# Patient Record
Sex: Male | Born: 1960 | Race: White | Hispanic: No | Marital: Married | Smoking: Never smoker
Health system: Southern US, Community
[De-identification: ages and names within clinical notes are randomized; demographics above are authoritative.]

## PROBLEM LIST (undated history)

## (undated) DIAGNOSIS — T39395A Adverse effect of other nonsteroidal anti-inflammatory drugs [NSAID], initial encounter: Secondary | ICD-10-CM

## (undated) DIAGNOSIS — G43909 Migraine, unspecified, not intractable, without status migrainosus: Secondary | ICD-10-CM

## (undated) DIAGNOSIS — K922 Gastrointestinal hemorrhage, unspecified: Secondary | ICD-10-CM

## (undated) HISTORY — PX: APPENDECTOMY: SHX54

---

## 2002-07-29 ENCOUNTER — Inpatient Hospital Stay (HOSPITAL_COMMUNITY): Admission: EM | Admit: 2002-07-29 | Discharge: 2002-07-29 | Payer: Self-pay | Admitting: *Deleted

## 2002-07-29 ENCOUNTER — Encounter: Payer: Self-pay | Admitting: *Deleted

## 2005-05-08 ENCOUNTER — Encounter: Admission: RE | Admit: 2005-05-08 | Discharge: 2005-05-08 | Payer: Self-pay | Admitting: Family Medicine

## 2005-08-21 ENCOUNTER — Ambulatory Visit (HOSPITAL_COMMUNITY): Admission: RE | Admit: 2005-08-21 | Discharge: 2005-08-21 | Payer: Self-pay | Admitting: Gastroenterology

## 2007-10-11 ENCOUNTER — Ambulatory Visit: Payer: Self-pay | Admitting: Family Medicine

## 2011-05-11 ENCOUNTER — Other Ambulatory Visit (INDEPENDENT_AMBULATORY_CARE_PROVIDER_SITE_OTHER): Payer: BC Managed Care – PPO

## 2011-05-11 DIAGNOSIS — Z23 Encounter for immunization: Secondary | ICD-10-CM

## 2011-05-11 DIAGNOSIS — Z0000 Encounter for general adult medical examination without abnormal findings: Secondary | ICD-10-CM

## 2016-08-16 ENCOUNTER — Emergency Department (HOSPITAL_COMMUNITY): Payer: BC Managed Care – PPO

## 2016-08-16 ENCOUNTER — Emergency Department (HOSPITAL_COMMUNITY)
Admission: EM | Admit: 2016-08-16 | Discharge: 2016-08-16 | Disposition: A | Discharge status: Home / Self Care | Payer: BC Managed Care – PPO | Attending: Emergency Medicine | Admitting: Emergency Medicine

## 2016-08-16 ENCOUNTER — Encounter (HOSPITAL_COMMUNITY): Payer: Self-pay | Admitting: Nurse Practitioner

## 2016-08-16 DIAGNOSIS — S42031A Displaced fracture of lateral end of right clavicle, initial encounter for closed fracture: Secondary | ICD-10-CM | POA: Diagnosis not present

## 2016-08-16 DIAGNOSIS — Z79899 Other long term (current) drug therapy: Secondary | ICD-10-CM | POA: Insufficient documentation

## 2016-08-16 DIAGNOSIS — Y999 Unspecified external cause status: Secondary | ICD-10-CM | POA: Diagnosis not present

## 2016-08-16 DIAGNOSIS — S42001A Fracture of unspecified part of right clavicle, initial encounter for closed fracture: Secondary | ICD-10-CM

## 2016-08-16 DIAGNOSIS — Z791 Long term (current) use of non-steroidal anti-inflammatories (NSAID): Secondary | ICD-10-CM | POA: Insufficient documentation

## 2016-08-16 DIAGNOSIS — Y92410 Unspecified street and highway as the place of occurrence of the external cause: Secondary | ICD-10-CM | POA: Insufficient documentation

## 2016-08-16 DIAGNOSIS — Y9355 Activity, bike riding: Secondary | ICD-10-CM | POA: Insufficient documentation

## 2016-08-16 DIAGNOSIS — S4991XA Unspecified injury of right shoulder and upper arm, initial encounter: Secondary | ICD-10-CM | POA: Diagnosis present

## 2016-08-16 HISTORY — DX: Gastrointestinal hemorrhage, unspecified: K92.2

## 2016-08-16 HISTORY — DX: Migraine, unspecified, not intractable, without status migrainosus: G43.909

## 2016-08-16 HISTORY — DX: Adverse effect of other nonsteroidal anti-inflammatory drugs (NSAID), initial encounter: T39.395A

## 2016-08-16 MED ORDER — HYDROCODONE-ACETAMINOPHEN 5-325 MG PO TABS: 2.0000 | ORAL_TABLET | ORAL | 0 refills | Status: AC | PRN

## 2016-08-16 MED ORDER — HYDROCODONE-ACETAMINOPHEN 5-325 MG PO TABS
1.0000 | ORAL_TABLET | Freq: Once | ORAL | Status: AC
  Administered 2016-08-16: 1 via ORAL
  Filled 2016-08-16: qty 1

## 2016-08-16 MED ORDER — ONDANSETRON 4 MG PO TBDP
4.0000 mg | ORAL_TABLET | Freq: Once | ORAL | Status: AC
  Administered 2016-08-16: 4 mg via ORAL
  Filled 2016-08-16: qty 1
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
## 2016-08-16 NOTE — ED Notes (Signed)
PT DISCHARGED. INSTRUCTIONS AND PRESCRIPTION GIVEN. AAOX4. PT IN NO APPARENT DISTRESS. THE OPPORTUNITY TO ASK QUESTIONS WAS PROVIDED. 

## 2016-08-16 NOTE — Discharge Instructions (Signed)
[facility] for follow up appointment.
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
## 2016-08-16 NOTE — ED Notes (Signed)
[facility] phones to tell us pt. Will be coming here via p.o.v. With dx of clavicle (and possible) scapula fracture.

## 2016-09-25 ENCOUNTER — Ambulatory Visit (INDEPENDENT_AMBULATORY_CARE_PROVIDER_SITE_OTHER): Payer: BC Managed Care – PPO | Admitting: Orthopedic Surgery

## 2016-09-25 ENCOUNTER — Ambulatory Visit (INDEPENDENT_AMBULATORY_CARE_PROVIDER_SITE_OTHER): Payer: BC Managed Care – PPO

## 2016-09-25 ENCOUNTER — Encounter (INDEPENDENT_AMBULATORY_CARE_PROVIDER_SITE_OTHER): Payer: Self-pay | Admitting: Orthopedic Surgery

## 2016-09-25 ENCOUNTER — Ambulatory Visit (INDEPENDENT_AMBULATORY_CARE_PROVIDER_SITE_OTHER): Payer: Self-pay

## 2016-09-25 DIAGNOSIS — M79641 Pain in right hand: Secondary | ICD-10-CM

## 2016-09-25 DIAGNOSIS — S42021D Displaced fracture of shaft of right clavicle, subsequent encounter for fracture with routine healing: Secondary | ICD-10-CM

## 2016-09-25 NOTE — Progress Notes (Signed)
   Post-Op Visit Note   Patient: George CallanderRobert Novak           Date of Birth: 05-30-1961           MRN: [id number] Visit Date: 09/25/2016 PCP: Pcp Not In System   Assessment & Plan:  Chief Complaint:  Chief Complaint  Patient presents with  . Right Shoulder - Follow-up, Fracture   Visit Diagnoses:  1. Closed displaced fracture of shaft of right clavicle with routine healing, subsequent encounter   2. Pain in right hand     Plan: George MaduroRobert is now 5 weeks out from right shoulder fracture of the distal clavicle.  He's been doing well.  He is sleeping on that side.  Has good range of motion.  On exam he has minimal tenderness over the distal clavicle as well as full active and passive range of motion.  He also has pain and tenderness of the base of fifth metacarpal.  No real tenderness at the TFCC joint and EC tendon is functional and intact.  Plan at this time is for gradual return to activity.  If he remains symptomatic in that right hand MRI scan would be indicated around December.  He'll call get that set up, see him back as needed  Follow-Up Instructions: No Follow-up on file.   Orders:  Orders Placed This Encounter  Procedures  . XR Clavicle Right  . XR Hand Complete Right   No orders of the defined types were placed in this encounter.    PMFS History: There are no active problems to display for this patient.  Past Medical History:  Diagnosis Date  . GI bleed due to NSAIDs   . Migraine     No family history on file.  Past Surgical History:  Procedure Laterality Date  . APPENDECTOMY     Social History   Occupational History  . Not on file.   Social History Main Topics  . Smoking status: Never Smoker  . Smokeless tobacco: Never Used  . Alcohol use Yes  . Drug use: No  . Sexual activity: Not on file
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
